# Patient Record
Sex: Female | Born: 1945 | Race: White | Hispanic: No | Smoking: Former smoker
Health system: Southern US, Community
[De-identification: ages and names within clinical notes are randomized; demographics above are authoritative.]

---

## 2004-05-16 ENCOUNTER — Ambulatory Visit (HOSPITAL_COMMUNITY): Admission: RE | Admit: 2004-05-16 | Discharge: 2004-05-16 | Payer: Self-pay | Admitting: Oncology

## 2004-10-25 ENCOUNTER — Other Ambulatory Visit: Admission: RE | Admit: 2004-10-25 | Discharge: 2004-10-25 | Payer: Self-pay | Admitting: Obstetrics and Gynecology

## 2004-11-08 ENCOUNTER — Ambulatory Visit: Payer: Self-pay | Admitting: Oncology

## 2004-11-21 ENCOUNTER — Encounter: Admission: RE | Admit: 2004-11-21 | Discharge: 2004-11-21 | Payer: Self-pay | Admitting: Obstetrics and Gynecology

## 2005-01-31 ENCOUNTER — Ambulatory Visit: Payer: Self-pay | Admitting: Oncology

## 2005-07-25 ENCOUNTER — Ambulatory Visit: Payer: Self-pay | Admitting: Oncology

## 2005-12-08 ENCOUNTER — Encounter: Admission: RE | Admit: 2005-12-08 | Discharge: 2005-12-08 | Payer: Self-pay | Admitting: Obstetrics and Gynecology

## 2006-01-09 ENCOUNTER — Ambulatory Visit: Payer: Self-pay | Admitting: Oncology

## 2006-06-26 ENCOUNTER — Ambulatory Visit: Payer: Self-pay | Admitting: Oncology

## 2006-12-04 ENCOUNTER — Ambulatory Visit: Payer: Self-pay | Admitting: Oncology

## 2006-12-05 ENCOUNTER — Ambulatory Visit: Payer: Self-pay | Admitting: Oncology

## 2006-12-13 ENCOUNTER — Encounter: Admission: RE | Admit: 2006-12-13 | Discharge: 2006-12-13 | Payer: Self-pay | Admitting: Obstetrics and Gynecology

## 2007-05-21 ENCOUNTER — Ambulatory Visit: Payer: Self-pay | Admitting: Oncology

## 2007-12-20 ENCOUNTER — Encounter: Admission: RE | Admit: 2007-12-20 | Discharge: 2007-12-20 | Payer: Self-pay | Admitting: Oncology

## 2008-12-21 ENCOUNTER — Encounter: Admission: RE | Admit: 2008-12-21 | Discharge: 2008-12-21 | Payer: Self-pay | Admitting: Oncology

## 2009-12-22 ENCOUNTER — Encounter: Admission: RE | Admit: 2009-12-22 | Discharge: 2009-12-22 | Payer: Self-pay | Admitting: Oncology

## 2011-01-10 ENCOUNTER — Other Ambulatory Visit: Payer: Self-pay | Admitting: Oncology

## 2011-01-10 DIAGNOSIS — Z9011 Acquired absence of right breast and nipple: Secondary | ICD-10-CM

## 2011-01-24 ENCOUNTER — Ambulatory Visit
Admission: RE | Admit: 2011-01-24 | Discharge: 2011-01-24 | Disposition: A | Discharge status: Home / Self Care | Payer: PRIVATE HEALTH INSURANCE | Source: Ambulatory Visit | Attending: Oncology | Admitting: Oncology

## 2011-01-24 DIAGNOSIS — Z9011 Acquired absence of right breast and nipple: Secondary | ICD-10-CM

## 2012-01-16 ENCOUNTER — Other Ambulatory Visit: Payer: Self-pay | Admitting: Oncology

## 2012-01-16 DIAGNOSIS — Z1231 Encounter for screening mammogram for malignant neoplasm of breast: Secondary | ICD-10-CM

## 2012-01-31 ENCOUNTER — Ambulatory Visit
Admission: RE | Admit: 2012-01-31 | Discharge: 2012-01-31 | Disposition: A | Discharge status: Home / Self Care | Payer: PRIVATE HEALTH INSURANCE | Source: Ambulatory Visit | Attending: Oncology | Admitting: Oncology

## 2012-01-31 DIAGNOSIS — Z1231 Encounter for screening mammogram for malignant neoplasm of breast: Secondary | ICD-10-CM

## 2012-02-06 ENCOUNTER — Other Ambulatory Visit: Payer: Self-pay | Admitting: Oncology

## 2012-02-06 DIAGNOSIS — R928 Other abnormal and inconclusive findings on diagnostic imaging of breast: Secondary | ICD-10-CM

## 2012-02-08 ENCOUNTER — Ambulatory Visit
Admission: RE | Admit: 2012-02-08 | Discharge: 2012-02-08 | Disposition: A | Discharge status: Home / Self Care | Payer: Medicare Other | Source: Ambulatory Visit | Attending: Oncology | Admitting: Oncology

## 2012-02-08 DIAGNOSIS — R928 Other abnormal and inconclusive findings on diagnostic imaging of breast: Secondary | ICD-10-CM

## 2013-02-12 ENCOUNTER — Other Ambulatory Visit: Payer: Self-pay | Admitting: Oncology

## 2013-02-12 DIAGNOSIS — Z1231 Encounter for screening mammogram for malignant neoplasm of breast: Secondary | ICD-10-CM

## 2013-03-13 ENCOUNTER — Ambulatory Visit: Payer: Medicare Other

## 2013-04-09 DIAGNOSIS — N289 Disorder of kidney and ureter, unspecified: Secondary | ICD-10-CM | POA: Insufficient documentation

## 2013-06-17 IMAGING — MG MM DIGITAL DIAGNOSTIC LIMITED*L*
2 series · 2 of 2 positions shown · non-contrast
Comparison: [DATE] [DATE], [DATE], [DATE] [DATE], [DATE]

CLINICAL DATA: Called back from screening mammogram for possible
mass left breast

DIGITAL DIAGNOSTIC LEFT MAMMOGRAM February 08, 2012

[L CC]
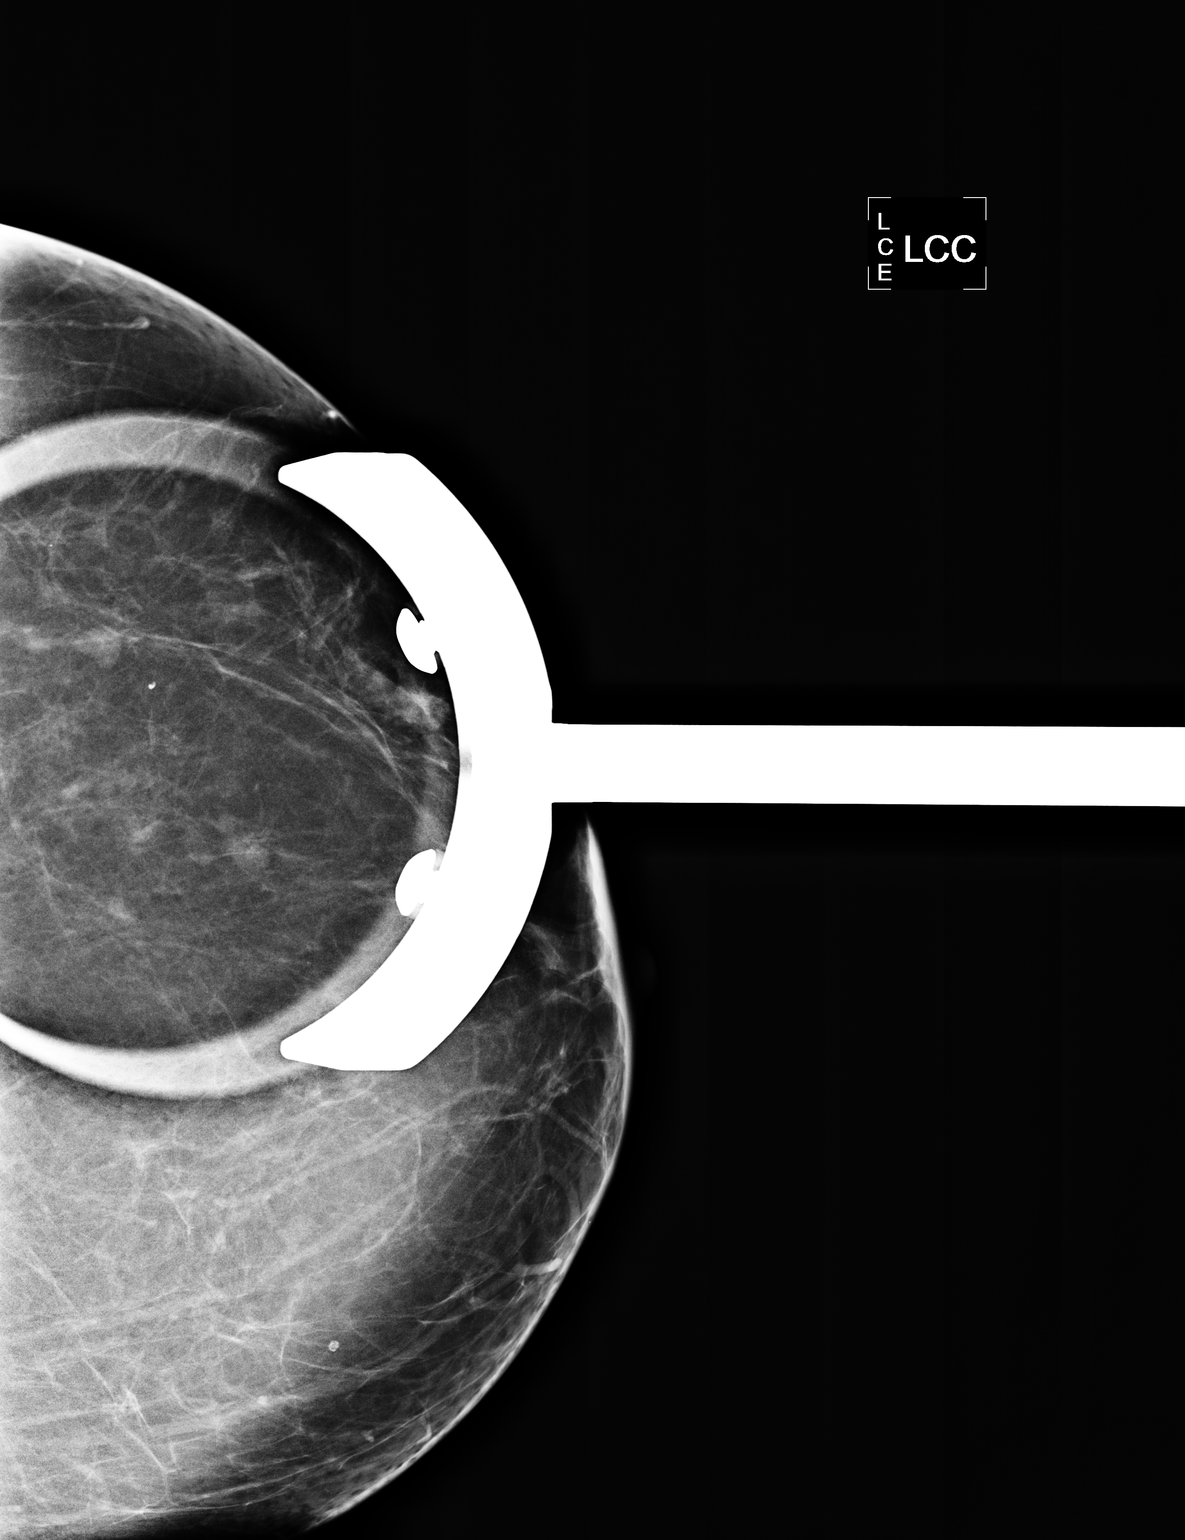

[L MLO]
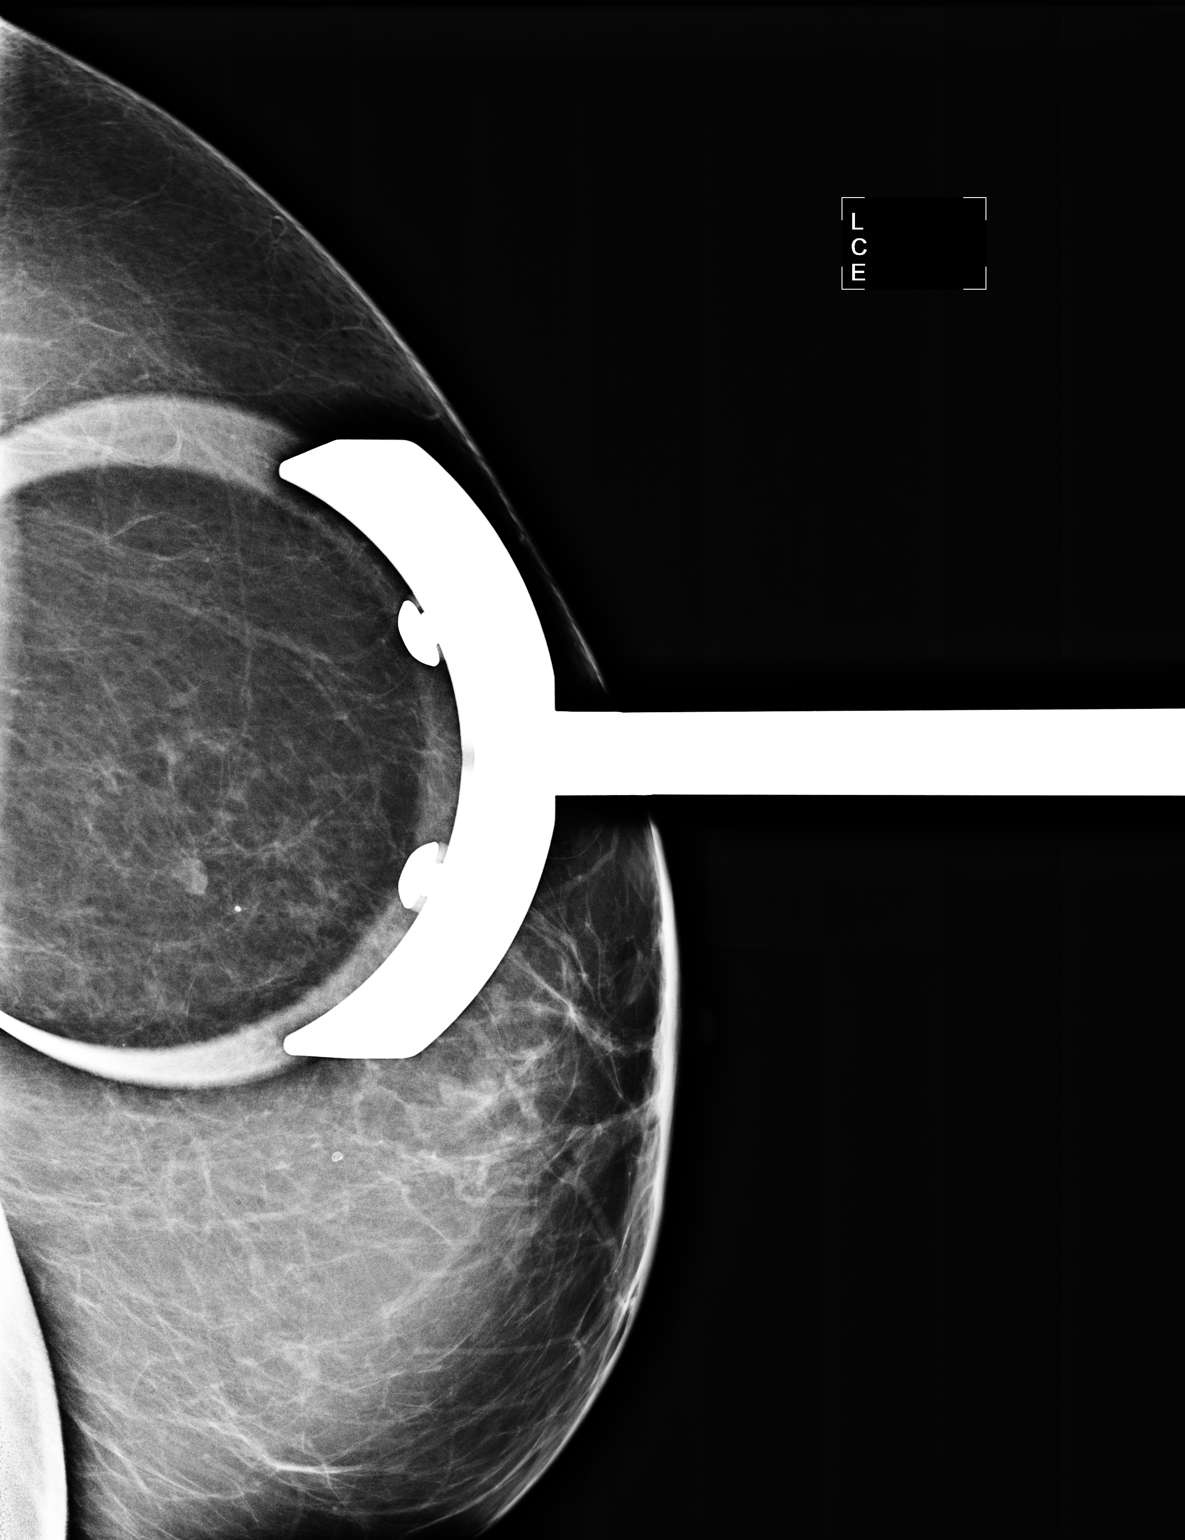

[2 of 2 positions shown; findings below may reference images not displayed]

FINDINGS: Spot compression CC and MLO views of the left breast are
submitted.  Previously questioned asymmetry does not persist and is
unchanged compared prior mammogram December 22, 2009.
IMPRESSION: Benign findings, recommend routine screening mammogram back on
schedule.

BI-RADS CATEGORY 2:  Benign finding(s).

## 2013-07-23 DIAGNOSIS — J45909 Unspecified asthma, uncomplicated: Secondary | ICD-10-CM | POA: Insufficient documentation

## 2014-03-16 ENCOUNTER — Ambulatory Visit: Payer: Medicare Other

## 2016-02-28 DIAGNOSIS — Z853 Personal history of malignant neoplasm of breast: Secondary | ICD-10-CM

## 2017-03-01 DIAGNOSIS — Z853 Personal history of malignant neoplasm of breast: Secondary | ICD-10-CM | POA: Diagnosis not present

## 2018-03-08 DIAGNOSIS — Z170 Estrogen receptor positive status [ER+]: Secondary | ICD-10-CM

## 2018-03-08 DIAGNOSIS — Z9221 Personal history of antineoplastic chemotherapy: Secondary | ICD-10-CM

## 2018-03-08 DIAGNOSIS — Z853 Personal history of malignant neoplasm of breast: Secondary | ICD-10-CM | POA: Diagnosis not present

## 2018-03-08 DIAGNOSIS — Z9011 Acquired absence of right breast and nipple: Secondary | ICD-10-CM | POA: Diagnosis not present

## 2019-03-10 DIAGNOSIS — Z853 Personal history of malignant neoplasm of breast: Secondary | ICD-10-CM

## 2019-08-13 DIAGNOSIS — I499 Cardiac arrhythmia, unspecified: Secondary | ICD-10-CM | POA: Insufficient documentation

## 2019-08-14 DIAGNOSIS — Z87891 Personal history of nicotine dependence: Secondary | ICD-10-CM | POA: Insufficient documentation

## 2019-08-14 DIAGNOSIS — I491 Atrial premature depolarization: Secondary | ICD-10-CM | POA: Insufficient documentation

## 2019-08-14 DIAGNOSIS — I493 Ventricular premature depolarization: Secondary | ICD-10-CM | POA: Insufficient documentation

## 2019-08-14 DIAGNOSIS — Z789 Other specified health status: Secondary | ICD-10-CM | POA: Insufficient documentation

## 2020-04-01 DIAGNOSIS — Z853 Personal history of malignant neoplasm of breast: Secondary | ICD-10-CM | POA: Diagnosis not present

## 2020-04-01 DIAGNOSIS — Z170 Estrogen receptor positive status [ER+]: Secondary | ICD-10-CM | POA: Diagnosis not present

## 2020-06-07 DIAGNOSIS — C50919 Malignant neoplasm of unspecified site of unspecified female breast: Secondary | ICD-10-CM

## 2020-11-01 NOTE — Progress Notes (Signed)
[facility]  [address] [place],  [place]  [address] [phone]  Clinic Day:  11/02/2020  Referring physician: No ref. provider found   HISTORY OF PRESENT ILLNESS:  The patient is a 74 y.o. 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  She is currently taking letrozole for her adjuvant endocrine therapy.  She comes in today for routine follow-up.  Since her last visit, the patient has been doing well.  She does have occasional musculoskeletal issues with her letrozole, but they are manageable.  She denies having any changes in her breast which concern her for early disease recurrence.  Of note, she also has a history of stage IIA hormone positive breast cancer, for which she underwent a right modified radical mastectomy in 2003, followed by 5 years of endocrine therapy.   PHYSICAL EXAM:  Blood pressure (!) 194/86, pulse 89, temperature 97.6 F (36.4 C), temperature source Oral, resp. rate 16, height 5\' 1"  (1.549 m), weight 156 lb 3.2 oz (70.9 kg), SpO2 90 %. Wt Readings from Last 3 Encounters:  11/02/20 156 lb 3.2 oz (70.9 kg)   Body mass index is 29.51 kg/m. Performance status (ECOG): 0 - Asymptomatic Physical Exam Constitutional:      Appearance: Normal appearance.  HENT:     Mouth/Throat:     Pharynx: Oropharynx is clear. No oropharyngeal exudate.  Cardiovascular:     Rate and Rhythm: Normal rate and regular rhythm.     Heart sounds: No murmur heard.  No friction rub. No gallop.   Pulmonary:     Breath sounds: Normal breath sounds.  Chest:     Breasts:        Right: No swelling, bleeding, inverted nipple, mass, nipple discharge or skin change.        Left: No swelling, bleeding, inverted nipple, mass, nipple discharge or skin change.  Abdominal:     General: Bowel sounds are normal. There is no distension.     Palpations: Abdomen is soft. There is no mass.      Tenderness: There is no abdominal tenderness.  Musculoskeletal:        General: No tenderness.     Cervical back: Normal range of motion and neck supple.     Right lower leg: No edema.     Left lower leg: No edema.  Lymphadenopathy:     Cervical: No cervical adenopathy.     Right cervical: No superficial, deep or posterior cervical adenopathy.    Left cervical: No superficial, deep or posterior cervical adenopathy.     Upper Body:     Right upper body: No supraclavicular or axillary adenopathy.     Left upper body: No supraclavicular or axillary adenopathy.     Lower Body: No right inguinal adenopathy. No left inguinal adenopathy.  Skin:    Coloration: Skin is not jaundiced.     Findings: No lesion or rash.  Neurological:     General: No focal deficit present.     Mental Status: She is alert and oriented to person, place, and time. Mental status is at baseline.  Psychiatric:        Mood and Affect: Mood normal.        Behavior: Behavior normal.        Thought Content: Thought content normal.        Judgment: Judgment normal.      ASSESSMENT & PLAN:  Assessment/Plan:  A 74 y.o. 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  Based upon her clinical breast exam today, the patient remains disease free.  She knows to continue taking letrozole on a daily basis for her adjuvant endocrine therapy.  I will see her back in 4 months for repeat clinical assessment.  The patient understands all the plans discussed today and is in agreement with them.      Maloni Musleh Macarthur Critchley, MD

## 2020-11-02 ENCOUNTER — Other Ambulatory Visit: Payer: Self-pay

## 2020-11-02 ENCOUNTER — Inpatient Hospital Stay: Payer: Medicare Other | Attending: Oncology | Admitting: Oncology

## 2020-11-02 VITALS — BP 194/86 | HR 89 | Temp 97.6°F | Resp 16 | Ht 61.0 in | Wt 156.2 lb

## 2020-11-02 DIAGNOSIS — C50411 Malignant neoplasm of upper-outer quadrant of right female breast: Secondary | ICD-10-CM | POA: Insufficient documentation

## 2020-11-02 DIAGNOSIS — C50212 Malignant neoplasm of upper-inner quadrant of left female breast: Secondary | ICD-10-CM | POA: Diagnosis not present

## 2020-11-02 DIAGNOSIS — C50412 Malignant neoplasm of upper-outer quadrant of left female breast: Secondary | ICD-10-CM | POA: Insufficient documentation

## 2020-11-02 DIAGNOSIS — Z170 Estrogen receptor positive status [ER+]: Secondary | ICD-10-CM

## 2021-03-03 ENCOUNTER — Inpatient Hospital Stay: Payer: Medicare Other | Admitting: Oncology

## 2021-03-03 NOTE — Progress Notes (Signed)
[facility]  [address] [place],  [place]  [address] [phone]  Clinic Day:  03/04/2021  Referring physician: No ref. provider found   HISTORY OF PRESENT ILLNESS:  The patient is a 75 y.o. 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  She is currently taking letrozole for her adjuvant endocrine therapy.  She comes in today for routine follow-up.  Since her last visit, the patient has been doing well.  She does have occasional musculoskeletal issues with her letrozole, but they are manageable.  She denies having any changes in her breast which concern her for early disease recurrence.  Of note, she also has a history of stage IIA hormone positive breast cancer, for which she underwent a right modified radical mastectomy in 2003, followed by 5 years of endocrine therapy.   PHYSICAL EXAM:  Blood pressure (!) 178/98, pulse 90, temperature 99 F (37.2 C), resp. rate 16, height 5\' 1"  (1.549 m), weight 152 lb (68.9 kg), SpO2 92 %. Wt Readings from Last 3 Encounters:  03/04/21 152 lb (68.9 kg)  11/02/20 156 lb 3.2 oz (70.9 kg)   Body mass index is 28.72 kg/m. Performance status (ECOG): 0 - Asymptomatic Physical Exam Constitutional:      Appearance: Normal appearance.  HENT:     Mouth/Throat:     Pharynx: Oropharynx is clear. No oropharyngeal exudate.  Cardiovascular:     Rate and Rhythm: Normal rate and regular rhythm.     Heart sounds: No murmur heard. No friction rub. No gallop.   Pulmonary:     Breath sounds: Normal breath sounds.  Chest:  Breasts:     Right: No swelling, bleeding, inverted nipple, mass, nipple discharge, skin change, axillary adenopathy or supraclavicular adenopathy.     Left: No swelling, bleeding, inverted nipple, mass, nipple discharge, skin change, axillary adenopathy or supraclavicular adenopathy.    Abdominal:     General: Bowel sounds are  normal. There is no distension.     Palpations: Abdomen is soft. There is no mass.     Tenderness: There is no abdominal tenderness.  Musculoskeletal:        General: No tenderness.     Cervical back: Normal range of motion and neck supple.     Right lower leg: No edema.     Left lower leg: No edema.  Lymphadenopathy:     Cervical: No cervical adenopathy.     Right cervical: No superficial, deep or posterior cervical adenopathy.    Left cervical: No superficial, deep or posterior cervical adenopathy.     Upper Body:     Right upper body: No supraclavicular or axillary adenopathy.     Left upper body: No supraclavicular or axillary adenopathy.     Lower Body: No right inguinal adenopathy. No left inguinal adenopathy.  Skin:    Coloration: Skin is not jaundiced.     Findings: No lesion or rash.  Neurological:     General: No focal deficit present.     Mental Status: She is alert and oriented to person, place, and time. Mental status is at baseline.  Psychiatric:        Mood and Affect: Mood normal.        Behavior: Behavior normal.        Thought Content: Thought content normal.        Judgment: Judgment normal.    ASSESSMENT & PLAN:  Assessment/Plan:  A 75 y.o. 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  Based upon her clinical breast exam today, the patient remains disease free.  She knows to continue taking letrozole on a daily basis for her adjuvant endocrine therapy.  I will see her back in 4 months for repeat clinical assessment.  Her mammogram has already been scheduled before her next visit for her continued radiographic breast cancer surveillance.  The patient understands all the plans discussed today and is in agreement with them.    Rajanee Schuelke Macarthur Critchley, MD

## 2021-03-04 ENCOUNTER — Encounter: Payer: Self-pay | Admitting: Oncology

## 2021-03-04 ENCOUNTER — Inpatient Hospital Stay: Payer: Medicare Other | Attending: Oncology | Admitting: Oncology

## 2021-03-04 ENCOUNTER — Other Ambulatory Visit: Payer: Self-pay

## 2021-03-04 ENCOUNTER — Telehealth: Payer: Self-pay | Admitting: Oncology

## 2021-03-04 VITALS — BP 178/98 | HR 90 | Temp 99.0°F | Resp 16 | Ht 61.0 in | Wt 152.0 lb

## 2021-03-04 DIAGNOSIS — Z170 Estrogen receptor positive status [ER+]: Secondary | ICD-10-CM

## 2021-03-04 DIAGNOSIS — C50412 Malignant neoplasm of upper-outer quadrant of left female breast: Secondary | ICD-10-CM

## 2021-03-04 NOTE — Telephone Encounter (Signed)
Per 3/18 los next appt schd and given to patient

## 2021-04-01 ENCOUNTER — Ambulatory Visit: Payer: Medicare Other | Admitting: Oncology

## 2021-06-30 NOTE — Progress Notes (Signed)
[facility]  [address] [place],  [place]  [address] [phone]  Clinic Day:  07/04/2021  Referring physician: [place]*  This document serves as a record of services personally performed by Marice Potter, MD. It was created on their behalf by Curry,Lauren E, a trained medical scribe. The creation of this record is based on the scribe's personal observations and the provider's statements to them.  HISTORY OF PRESENT ILLNESS:  The patient is a 75 y.o. 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  She comes in today for routine follow-up.  Since her last visit, the patient has been doing well.  She brings to my attention that she has stopped taking letrozole 1-2 months ago as she felt it was causing "heart issues."  When asking further about this, she was vague in the symptoms which caused her to stop her endocrine therapy.   She denies having any changes in her breasts which concern her for early disease recurrence.  Of note, her annual mammogram in April 2022 showed no evidence of disease recurrence. This patient also has a history of stage IIA hormone positive breast cancer, for which she underwent a right modified radical mastectomy in 2003, followed by 5 years of endocrine therapy.   PHYSICAL EXAM:  Blood pressure (!) 168/80, pulse 82, temperature 98.4 F (36.9 C), resp. rate 18, height 5\' 1"  (1.549 m), weight 150 lb 4.8 oz (68.2 kg), SpO2 92 %. Wt Readings from Last 3 Encounters:  07/04/21 150 lb 4.8 oz (68.2 kg)  03/04/21 152 lb (68.9 kg)  11/02/20 156 lb 3.2 oz (70.9 kg)   Body mass index is 28.4 kg/m. Performance status (ECOG): 0 - Asymptomatic Physical Exam Constitutional:      Appearance: Normal appearance.  HENT:     Mouth/Throat:     Pharynx: Oropharynx is clear. No oropharyngeal exudate.  Cardiovascular:     Rate and Rhythm: Normal rate and  regular rhythm.     Heart sounds: No murmur heard.   No friction rub. No gallop.  Pulmonary:     Breath sounds: Normal breath sounds.  Chest:  Breasts:    Right: No swelling, bleeding, inverted nipple, mass, nipple discharge, skin change, axillary adenopathy or supraclavicular adenopathy.     Left: No swelling, bleeding, inverted nipple, mass, nipple discharge, skin change, axillary adenopathy or supraclavicular adenopathy.  Abdominal:     General: Bowel sounds are normal. There is no distension.     Palpations: Abdomen is soft. There is no mass.     Tenderness: There is no abdominal tenderness.  Musculoskeletal:        General: No tenderness.     Cervical back: Normal range of motion and neck supple.     Right lower leg: No edema.     Left lower leg: No edema.  Lymphadenopathy:     Cervical: No cervical adenopathy.     Right cervical: No superficial, deep or posterior cervical adenopathy.    Left cervical: No superficial, deep or posterior cervical adenopathy.     Upper Body:     Right upper body: No supraclavicular or axillary adenopathy.     Left upper body: No supraclavicular or axillary adenopathy.     Lower Body: No right inguinal adenopathy. No left inguinal adenopathy.  Skin:    Coloration: Skin is not jaundiced.     Findings: No lesion or rash.  Neurological:     General: No focal deficit present.     Mental Status: She is alert and oriented to person, place, and time. Mental status is at baseline.  Psychiatric:        Mood and Affect: Mood normal.        Behavior: Behavior normal.        Thought Content: Thought content normal.        Judgment: Judgment normal.   ASSESSMENT & PLAN:  Assessment/Plan:  A 75 y.o. 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  Based upon her clinical breast exam today and her recent mammogram, the patient remains disease free.  The patient is not interested in restarting  any form of endocrine therapy for her disease.  She understands this is not what I recommend for her hormone positive disease, but she is willing to move forward with the side effects of adjuvant endocrine therapy not impacting her life.  I will see her back in 4 months for repeat clinical assessment.  The patient understands all the plans discussed today and is in agreement with them.     I, Rita Ohara, am acting as scribe for Marice Potter, MD    I have reviewed this report as typed by the medical scribe, and it is complete and accurate.  Tiger Spieker Macarthur Critchley, MD

## 2021-07-04 ENCOUNTER — Other Ambulatory Visit: Payer: Self-pay

## 2021-07-04 ENCOUNTER — Inpatient Hospital Stay: Payer: Medicare Other | Attending: Oncology | Admitting: Oncology

## 2021-07-04 ENCOUNTER — Telehealth: Payer: Self-pay | Admitting: Oncology

## 2021-07-04 VITALS — BP 168/80 | HR 82 | Temp 98.4°F | Resp 18 | Ht 61.0 in | Wt 150.3 lb

## 2021-07-04 DIAGNOSIS — Z170 Estrogen receptor positive status [ER+]: Secondary | ICD-10-CM | POA: Diagnosis not present

## 2021-07-04 DIAGNOSIS — C50412 Malignant neoplasm of upper-outer quadrant of left female breast: Secondary | ICD-10-CM

## 2021-07-04 NOTE — Telephone Encounter (Signed)
Per 7/18 LOS, patient scheduled for Nov Appt's.  Gave patient Appt [place]

## 2021-10-28 NOTE — Progress Notes (Incomplete)
[facility]  [address] [place],  [place]  [address] [phone]  Clinic Day:  11/04/2021  Referring physician: [place]*  This document serves as a record of services personally performed by Marice Potter, MD. It was created on their behalf by Curry,Lauren E, a trained medical scribe. The creation of this record is based on the scribe's personal observations and the provider's statements to them.  HISTORY OF PRESENT ILLNESS:  The patient is a 75 y.o. 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  She comes in today for routine follow-up.  Since her last visit, the patient has been doing well.  She brings to my attention that she has stopped taking letrozole 1-2 months ago as she felt it was causing "heart issues."  When asking further about this, she was vague in the symptoms which caused her to stop her endocrine therapy.   She denies having any changes in her breasts which concern her for early disease recurrence.  Of note, her annual mammogram in April 2022 showed no evidence of disease recurrence. This patient also has a history of stage IIA hormone positive breast cancer, for which she underwent a right modified radical mastectomy in 2003, followed by 5 years of endocrine therapy.   PHYSICAL EXAM:  There were no vitals taken for this visit. Wt Readings from Last 3 Encounters:  07/04/21 150 lb 4.8 oz (68.2 kg)  03/04/21 152 lb (68.9 kg)  11/02/20 156 lb 3.2 oz (70.9 kg)   There is no height or weight on file to calculate BMI. Performance status (ECOG): 0 - Asymptomatic Physical Exam Constitutional:      Appearance: Normal appearance.  HENT:     Mouth/Throat:     Pharynx: Oropharynx is clear. No oropharyngeal exudate.  Cardiovascular:     Rate and Rhythm: Normal rate and regular rhythm.     Heart sounds: No murmur heard.   No friction rub. No gallop.   Pulmonary:     Breath sounds: Normal breath sounds.  Chest:  Breasts:    Right: No swelling, bleeding, inverted nipple, mass, nipple discharge, skin change, axillary adenopathy or supraclavicular adenopathy.     Left: No swelling, bleeding, inverted nipple, mass, nipple discharge, skin change, axillary adenopathy or supraclavicular adenopathy.  Abdominal:     General: Bowel sounds are normal. There is no distension.     Palpations: Abdomen is soft. There is no mass.     Tenderness: There is no abdominal tenderness.  Musculoskeletal:        General: No tenderness.     Cervical back: Normal range of motion and neck supple.     Right lower leg: No edema.     Left lower leg: No edema.  Lymphadenopathy:     Cervical: No cervical adenopathy.     Right cervical: No superficial, deep or posterior cervical adenopathy.    Left cervical: No superficial, deep or posterior cervical adenopathy.     Upper Body:     Right upper body: No supraclavicular or axillary adenopathy.     Left upper body: No supraclavicular or axillary adenopathy.     Lower Body: No right inguinal adenopathy. No left inguinal adenopathy.  Skin:    Coloration: Skin is not jaundiced.     Findings: No lesion or rash.  Neurological:     General: No focal deficit present.     Mental  Status: She is alert and oriented to person, place, and time. Mental status is at baseline.  Psychiatric:        Mood and Affect: Mood normal.        Behavior: Behavior normal.        Thought Content: Thought content normal.        Judgment: Judgment normal.   ASSESSMENT & PLAN:  Assessment/Plan:  A 75 y.o. 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  Based upon her clinical breast exam today, the patient remains disease free.  The patient is not interested in restarting any form of endocrine therapy for her disease.  She understands this is not what I recommend for her  hormone positive disease, but she is willing to move forward with the side effects of adjuvant endocrine therapy not impacting her life.  I will see her back in 4 months for repeat clinical assessment.  The patient understands all the plans discussed today and is in agreement with them.     I, Rita Ohara, am acting as scribe for Marice Potter, MD    I have reviewed this report as typed by the medical scribe, and it is complete and accurate.  Dequincy Macarthur Critchley, MD

## 2021-11-04 ENCOUNTER — Ambulatory Visit: Payer: Medicare Other | Admitting: Oncology

## 2021-11-09 NOTE — Progress Notes (Signed)
[facility]  [address] [place],  [place]  [address] [phone]  Clinic Day:  11/14/2021  Referring physician: [place]*  This document serves as a record of services personally performed by Marice Potter, MD. It was created on their behalf by Curry,Lauren E, a trained medical scribe. The creation of this record is based on the scribe's personal observations and the provider's statements to them.  HISTORY OF PRESENT ILLNESS:  The patient is a 75 y.o. 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  She comes in today for routine follow-up.  Since her last visit, the patient has been doing well.  She remains off endocrine therapy due to it causing nondescript side effects.   She denies having any changes in her breast/chest wall region which concern her for early disease recurrence.    This patient also has a history of stage IIA hormone positive breast cancer, for which she underwent a right modified radical mastectomy in 2003, followed by 5 years of endocrine therapy.   PHYSICAL EXAM:  Blood pressure 136/79, pulse 100, temperature 97.9 F (36.6 C), temperature source Oral, resp. rate 18, height 5\' 1"  (1.549 m), weight 141 lb 6.4 oz (64.1 kg), SpO2 92 %. Wt Readings from Last 3 Encounters:  11/14/21 141 lb 6.4 oz (64.1 kg)  07/04/21 150 lb 4.8 oz (68.2 kg)  03/04/21 152 lb (68.9 kg)   Body mass index is 26.72 kg/m. Performance status (ECOG): 0 - Asymptomatic Physical Exam Constitutional:      Appearance: Normal appearance.  HENT:     Mouth/Throat:     Pharynx: Oropharynx is clear. No oropharyngeal exudate.  Cardiovascular:     Rate and Rhythm: Normal rate and regular rhythm.     Heart sounds: No murmur heard.   No friction rub. No gallop.  Pulmonary:     Breath sounds: Normal breath sounds.  Chest:  Breasts:    Right: No swelling, bleeding, inverted  nipple, mass, nipple discharge or skin change.     Left: No swelling, bleeding, inverted nipple, mass, nipple discharge or skin change.     Comments: Reconstructive right breast Abdominal:     General: Bowel sounds are normal. There is no distension.     Palpations: Abdomen is soft. There is no mass.     Tenderness: There is no abdominal tenderness.  Musculoskeletal:        General: No tenderness.     Cervical back: Normal range of motion and neck supple.     Right lower leg: No edema.     Left lower leg: No edema.  Lymphadenopathy:     Cervical: No cervical adenopathy.     Right cervical: No superficial, deep or posterior cervical adenopathy.    Left cervical: No superficial, deep or posterior cervical adenopathy.     Upper Body:     Right upper body: No supraclavicular or axillary adenopathy.     Left upper body: No supraclavicular or axillary adenopathy.     Lower Body: No right inguinal adenopathy. No left inguinal adenopathy.  Skin:    Coloration: Skin is not jaundiced.     Findings: No lesion or rash.  Neurological:     General: No focal deficit present.     Mental Status: She is alert and oriented to person, place, and time. Mental status is at baseline.  Psychiatric:  Mood and Affect: Mood normal.        Behavior: Behavior normal.        Thought Content: Thought content normal.        Judgment: Judgment normal.   ASSESSMENT & PLAN:  Assessment/Plan:  A 75 y.o. 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  Based upon her clinical breast exam today, the patient remains disease free.  The patient remains uninterested in restarting endocrine therapy for her disease despite knowing it is standard of care to use it for her breast cancer.  Clinically she is doing well.  I will see her back in 4 months for repeat clinical assessment.  The patient understands all the plans discussed today and is in agreement  with them.     I, Rita Ohara, am acting as scribe for Marice Potter, MD    I have reviewed this report as typed by the medical scribe, and it is complete and accurate.  Axton Cihlar Macarthur Critchley, MD

## 2021-11-14 ENCOUNTER — Encounter: Payer: Self-pay | Admitting: Oncology

## 2021-11-14 ENCOUNTER — Telehealth: Payer: Self-pay | Admitting: Oncology

## 2021-11-14 ENCOUNTER — Other Ambulatory Visit: Payer: Self-pay

## 2021-11-14 ENCOUNTER — Inpatient Hospital Stay: Payer: Medicare Other | Attending: Oncology | Admitting: Oncology

## 2021-11-14 VITALS — BP 136/79 | HR 100 | Temp 97.9°F | Resp 18 | Ht 61.0 in | Wt 141.4 lb

## 2021-11-14 DIAGNOSIS — C50412 Malignant neoplasm of upper-outer quadrant of left female breast: Secondary | ICD-10-CM | POA: Diagnosis not present

## 2021-11-14 DIAGNOSIS — Z170 Estrogen receptor positive status [ER+]: Secondary | ICD-10-CM

## 2021-11-14 NOTE — Telephone Encounter (Signed)
Per 11/28 los next appt scheduled and given to patient-4 mth f/u

## 2022-03-13 NOTE — Progress Notes (Signed)
?[facility]  ?[address] ?[place],  [place]  [address] ?[phone] ? ?Clinic Day:  11/14/2021 ? ?Referring physician: [facility]* ? ? ?HISTORY OF PRESENT ILLNESS:  ?The patient is a 76 y.o. 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  She comes in today for routine follow-up.  Since her last visit, the patient has been doing well.  She remains off endocrine therapy due to it causing nondescript side effects and has consistently stated her disinterest in restarting such treatment.   She denies having any changes in her breast/chest wall region which concern her for early disease recurrence.  This patient also has a history of stage IIA hormone positive breast cancer, for which she underwent a right modified radical mastectomy in 2003, followed by 5 years of endocrine therapy.  ? ?PHYSICAL EXAM:  ?Blood pressure (!) 211/85, pulse 80, temperature 97.9 ?F (36.6 ?C), resp. rate 16, height '5\' 1"'$  (1.549 m), weight 144 lb 14.4 oz (65.7 kg), SpO2 90 %. ?Wt Readings from Last 3 Encounters:  ?03/14/22 144 lb 14.4 oz (65.7 kg)  ?11/14/21 141 lb 6.4 oz (64.1 kg)  ?07/04/21 150 lb 4.8 oz (68.2 kg)  ? ?Body mass index is 27.38 kg/m?Marland Kitchen ?Performance status (ECOG): 0 - Asymptomatic ?Physical Exam ?Constitutional:   ?   Appearance: Normal appearance.  ?HENT:  ?   Mouth/Throat:  ?   Pharynx: Oropharynx is clear. No oropharyngeal exudate.  ?Cardiovascular:  ?   Rate and Rhythm: Normal rate and regular rhythm.  ?   Heart sounds: No murmur heard. ?  No friction rub. No gallop.  ?Pulmonary:  ?   Breath sounds: Normal breath sounds.  ?Chest:  ?Breasts: ?   Right: No swelling, bleeding, inverted nipple, mass, nipple discharge or skin change.  ?   Left: No swelling, bleeding, inverted nipple, mass, nipple discharge or skin change.  ?   Comments: Reconstructed right breast ?Abdominal:  ?   General: Bowel sounds are normal.  There is no distension.  ?   Palpations: Abdomen is soft. There is no mass.  ?   Tenderness: There is no abdominal tenderness.  ?Musculoskeletal:     ?   General: No tenderness.  ?   Cervical back: Normal range of motion and neck supple.  ?   Right lower leg: No edema.  ?   Left lower leg: No edema.  ?Lymphadenopathy:  ?   Cervical: No cervical adenopathy.  ?   Right cervical: No superficial, deep or posterior cervical adenopathy. ?   Left cervical: No superficial, deep or posterior cervical adenopathy.  ?   Upper Body:  ?   Right upper body: No supraclavicular or axillary adenopathy.  ?   Left upper body: No supraclavicular or axillary adenopathy.  ?   Lower Body: No right inguinal adenopathy. No left inguinal adenopathy.  ?Skin: ?   Coloration: Skin is not jaundiced.  ?   Findings: No lesion or rash.  ?Neurological:  ?   General: No focal deficit present.  ?   Mental Status: She is alert and oriented to person, place, and time. Mental status is at baseline.  ?Psychiatric:     ?   Mood and Affect: Mood normal.     ?   Behavior: Behavior normal.     ?   Thought Content: Thought content normal.     ?   Judgment: Judgment  normal.  ? ?ASSESSMENT & PLAN:  ?Assessment/Plan:  A 76 y.o. 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  Based upon her clinical breast exam today, the patient remains disease free.  The patient remains uninterested in restarting endocrine therapy for her disease despite knowing it is standard of care to use it for her breast cancer.  Of note, she is already scheduled to undergo her annual mammogram in April 2023.  Clinically she is doing well.  As that is the case, I do feel comfortable spacing all future clinical breast exams out to every 6 months.  The patient understands all the plans discussed today and is in agreement with them.   ? ?Sehaj Kolden Macarthur Critchley, MD ? ? ? ?  ?

## 2022-03-14 ENCOUNTER — Other Ambulatory Visit: Payer: Self-pay

## 2022-03-14 ENCOUNTER — Telehealth: Payer: Self-pay | Admitting: Oncology

## 2022-03-14 ENCOUNTER — Inpatient Hospital Stay: Payer: Medicare Other | Attending: Oncology | Admitting: Oncology

## 2022-03-14 VITALS — BP 211/85 | HR 80 | Temp 97.9°F | Resp 16 | Ht 61.0 in | Wt 144.9 lb

## 2022-03-14 DIAGNOSIS — Z170 Estrogen receptor positive status [ER+]: Secondary | ICD-10-CM | POA: Diagnosis not present

## 2022-03-14 DIAGNOSIS — C50412 Malignant neoplasm of upper-outer quadrant of left female breast: Secondary | ICD-10-CM | POA: Diagnosis not present

## 2022-03-14 NOTE — Telephone Encounter (Signed)
Per 03/14/22 los next appt scheduled and confirmed with patient ?

## 2022-09-13 NOTE — Progress Notes (Signed)
[facility]  [address] [place],  [place]  [address] [phone]  Clinic Day:  09/14/2022  Referring physician: [facility]  HISTORY OF PRESENT ILLNESS:  The patient is a 76 y.o. 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  She comes in today for routine follow-up.  Since her last visit, the patient has been doing well.  She remains off endocrine therapy due to it causing nondescript side effects; she has consistently stated her disinterest in restarting endocrine therapy.  She denies having any changes in her breast/chest wall region which concern her for early disease recurrence.  Of note, her annual mammogram in April 2023 showed no evidence of disease recurrence.    This patient also has a history of stage IIA hormone positive breast cancer, for which she underwent a right modified radical mastectomy in 2003, followed by 5 years of endocrine therapy.   PHYSICAL EXAM:  Blood pressure (!) 146/81, pulse 81, temperature 98.4 F (36.9 C), resp. rate 16, height '5\' 1"'$  (1.549 m), weight 150 lb 4.8 oz (68.2 kg), SpO2 90 %. Wt Readings from Last 3 Encounters:  09/14/22 150 lb 4.8 oz (68.2 kg)  03/14/22 144 lb 14.4 oz (65.7 kg)  11/14/21 141 lb 6.4 oz (64.1 kg)   Body mass index is 28.4 kg/m. Performance status (ECOG): 0 - Asymptomatic Physical Exam Constitutional:      Appearance: Normal appearance.  HENT:     Mouth/Throat:     Pharynx: Oropharynx is clear. No oropharyngeal exudate.  Cardiovascular:     Rate and Rhythm: Normal rate and regular rhythm.     Heart sounds: No murmur heard.    No friction rub. No gallop.  Pulmonary:     Breath sounds: Normal breath sounds.  Chest:  Breasts:    Right: No swelling, bleeding, inverted nipple, mass, nipple discharge or skin change.     Left: No swelling, bleeding, inverted nipple, mass, nipple discharge or skin change.      Comments: Reconstructed right breast Abdominal:     General: Bowel sounds are normal. There is no distension.     Palpations: Abdomen is soft. There is no mass.     Tenderness: There is no abdominal tenderness.  Musculoskeletal:        General: No tenderness.     Cervical back: Normal range of motion and neck supple.     Right lower leg: No edema.     Left lower leg: No edema.  Lymphadenopathy:     Cervical: No cervical adenopathy.     Right cervical: No superficial, deep or posterior cervical adenopathy.    Left cervical: No superficial, deep or posterior cervical adenopathy.     Upper Body:     Right upper body: No supraclavicular or axillary adenopathy.     Left upper body: No supraclavicular or axillary adenopathy.     Lower Body: No right inguinal adenopathy. No left inguinal adenopathy.  Skin:    Coloration: Skin is not jaundiced.     Findings: No lesion or rash.  Neurological:     General: No focal deficit present.     Mental Status: She is alert and oriented to person, place, and time. Mental status is at baseline.  Psychiatric:        Mood and Affect: Mood normal.        Behavior: Behavior normal.  Thought Content: Thought content normal.        Judgment: Judgment normal.    ASSESSMENT & PLAN:  Assessment/Plan:  A 76 y.o. 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  Based upon her clinical breast exam today and recent mammogram, the patient remains disease free.  The patient remains uninterested in restarting endocrine therapy for her disease despite knowing it is standard of care for her breast cancer.  Clinically she is doing well.  As that is the case, I will see her back in 6 months for her next clinical breast exam.  The patient understands all the plans discussed today and is in agreement with them.    Kristie Bracewell Macarthur Critchley, MD

## 2022-09-14 ENCOUNTER — Inpatient Hospital Stay: Payer: Medicare Other | Attending: Oncology | Admitting: Oncology

## 2022-09-14 VITALS — BP 146/81 | HR 81 | Temp 98.4°F | Resp 16 | Ht 61.0 in | Wt 150.3 lb

## 2022-09-14 DIAGNOSIS — C50412 Malignant neoplasm of upper-outer quadrant of left female breast: Secondary | ICD-10-CM

## 2022-09-14 DIAGNOSIS — Z170 Estrogen receptor positive status [ER+]: Secondary | ICD-10-CM | POA: Diagnosis not present

## 2023-03-16 ENCOUNTER — Inpatient Hospital Stay: Payer: Medicare Other | Admitting: Oncology

## 2023-03-19 NOTE — Progress Notes (Unsigned)
[facility]  [address] [place],  [place]  [address] [phone]  Clinic Day:  09/14/2022  Referring physician: [facility]  HISTORY OF PRESENT ILLNESS:  The patient is a 77 y.o. 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  She comes in today for routine follow-up.  Since her last visit, the patient has been doing well.  She remains off endocrine therapy due to it causing nondescript side effects; she has consistently stated her disinterest in restarting endocrine therapy.  She denies having any changes in her breast/chest wall region which concern her for early disease recurrence.  Of note, her annual mammogram in April 2023 showed no evidence of disease recurrence.    This patient also has a history of stage IIA hormone positive breast cancer, for which she underwent a right modified radical mastectomy in 2003, followed by 5 years of endocrine therapy.   PHYSICAL EXAM:  There were no vitals taken for this visit. Wt Readings from Last 3 Encounters:  09/14/22 150 lb 4.8 oz (68.2 kg)  03/14/22 144 lb 14.4 oz (65.7 kg)  11/14/21 141 lb 6.4 oz (64.1 kg)   There is no height or weight on file to calculate BMI. Performance status (ECOG): 0 - Asymptomatic Physical Exam Constitutional:      Appearance: Normal appearance.  HENT:     Mouth/Throat:     Pharynx: Oropharynx is clear. No oropharyngeal exudate.  Cardiovascular:     Rate and Rhythm: Normal rate and regular rhythm.     Heart sounds: No murmur heard.    No friction rub. No gallop.  Pulmonary:     Breath sounds: Normal breath sounds.  Chest:  Breasts:    Right: No swelling, bleeding, inverted nipple, mass, nipple discharge or skin change.     Left: No swelling, bleeding, inverted nipple, mass, nipple discharge or skin change.     Comments: Reconstructed right breast Abdominal:     General: Bowel sounds are  normal. There is no distension.     Palpations: Abdomen is soft. There is no mass.     Tenderness: There is no abdominal tenderness.  Musculoskeletal:        General: No tenderness.     Cervical back: Normal range of motion and neck supple.     Right lower leg: No edema.     Left lower leg: No edema.  Lymphadenopathy:     Cervical: No cervical adenopathy.     Right cervical: No superficial, deep or posterior cervical adenopathy.    Left cervical: No superficial, deep or posterior cervical adenopathy.     Upper Body:     Right upper body: No supraclavicular or axillary adenopathy.     Left upper body: No supraclavicular or axillary adenopathy.     Lower Body: No right inguinal adenopathy. No left inguinal adenopathy.  Skin:    Coloration: Skin is not jaundiced.     Findings: No lesion or rash.  Neurological:     General: No focal deficit present.     Mental Status: She is alert and oriented to person, place, and time. Mental status is at baseline.  Psychiatric:        Mood and Affect: Mood normal.        Behavior: Behavior normal.        Thought Content: Thought content normal.        Judgment: Judgment  normal.    ASSESSMENT & PLAN:  Assessment/Plan:  A 77 y.o. 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  Based upon her clinical breast exam today and recent mammogram, the patient remains disease free.  The patient remains uninterested in restarting endocrine therapy for her disease despite knowing it is standard of care for her breast cancer.  Clinically she is doing well.  As that is the case, I will see her back in 6 months for her next clinical breast exam.  The patient understands all the plans discussed today and is in agreement with them.    Kenneth Cuaresma Macarthur Critchley, MD

## 2023-03-20 ENCOUNTER — Telehealth: Payer: Self-pay | Admitting: Oncology

## 2023-03-20 ENCOUNTER — Inpatient Hospital Stay: Payer: Medicare Other | Attending: Oncology | Admitting: Oncology

## 2023-03-20 VITALS — BP 175/79 | HR 88 | Temp 99.2°F | Resp 16 | Ht 61.0 in | Wt 148.3 lb

## 2023-03-20 DIAGNOSIS — C50412 Malignant neoplasm of upper-outer quadrant of left female breast: Secondary | ICD-10-CM

## 2023-03-20 DIAGNOSIS — Z170 Estrogen receptor positive status [ER+]: Secondary | ICD-10-CM | POA: Diagnosis not present

## 2023-03-20 NOTE — Telephone Encounter (Signed)
03/20/23 Next appt scheduled and confirmed with patient

## 2023-09-20 ENCOUNTER — Ambulatory Visit: Payer: Medicare Other | Admitting: Oncology

## 2023-10-09 ENCOUNTER — Inpatient Hospital Stay: Payer: Medicare Other | Admitting: Oncology

## 2023-10-09 NOTE — Progress Notes (Deleted)
[facility]  [address] [place],  [place]  [address] [phone]  Clinic Day:  03/20/2023  Referring physician: Retia Passe, NP  HISTORY OF PRESENT ILLNESS:  The patient is a 77 y.o. 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  She comes in today for routine follow-up.  Since her last visit, the patient has been doing well.  She remains off endocrine therapy due to it causing nondescript side effects; she continues to state her disinterest in restarting endocrine therapy.  She denies having any changes in her breast/chest wall region which concern her for early disease recurrence.  Of note, her annual mammogram in April 2024 showed no evidence of disease recurrence.    This patient also has a history of stage IIA hormone positive breast cancer, for which she underwent a right modified radical mastectomy in 2003, followed by 5 years of endocrine therapy.   PHYSICAL EXAM:  There were no vitals taken for this visit. Wt Readings from Last 3 Encounters:  03/20/23 148 lb 4.8 oz (67.3 kg)  09/14/22 150 lb 4.8 oz (68.2 kg)  03/14/22 144 lb 14.4 oz (65.7 kg)   There is no height or weight on file to calculate BMI. Performance status (ECOG): 0 - Asymptomatic Physical Exam Constitutional:      Appearance: Normal appearance.  HENT:     Mouth/Throat:     Pharynx: Oropharynx is clear. No oropharyngeal exudate.  Cardiovascular:     Rate and Rhythm: Normal rate and regular rhythm.     Heart sounds: No murmur heard.    No friction rub. No gallop.  Pulmonary:     Breath sounds: Normal breath sounds.  Chest:  Breasts:    Right: No swelling, bleeding, inverted nipple, mass, nipple discharge or skin change.     Left: No swelling, bleeding, inverted nipple, mass, nipple discharge or skin change.     Comments: Reconstructed right breast Abdominal:     General: Bowel sounds are normal.  There is no distension.     Palpations: Abdomen is soft. There is no mass.     Tenderness: There is no abdominal tenderness.  Musculoskeletal:        General: No tenderness.     Cervical back: Normal range of motion and neck supple.     Right lower leg: No edema.     Left lower leg: No edema.  Lymphadenopathy:     Cervical: No cervical adenopathy.     Right cervical: No superficial, deep or posterior cervical adenopathy.    Left cervical: No superficial, deep or posterior cervical adenopathy.     Upper Body:     Right upper body: No supraclavicular or axillary adenopathy.     Left upper body: No supraclavicular or axillary adenopathy.     Lower Body: No right inguinal adenopathy. No left inguinal adenopathy.  Skin:    Coloration: Skin is not jaundiced.     Findings: No lesion or rash.  Neurological:     General: No focal deficit present.     Mental Status: She is alert and oriented to person, place, and time. Mental status is at baseline.  Psychiatric:        Mood and Affect: Mood normal.        Behavior: Behavior normal.        Thought Content: Thought content normal.        Judgment:  Judgment normal.    ASSESSMENT & PLAN:  Assessment/Plan:  A 77 y.o. 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  Based upon her clinical breast exam today, the patient remains disease free.  As mentioned previously, the patient remains uninterested in restarting endocrine therapy for her disease despite knowing it is standard of care for her hormone positive breast cancer.  Clinically she is doing well.  As that is the case, I will see her back in 6 months for her next clinical breast exam.  The patient understands all the plans discussed today and is in agreement with them.    Jaishawn Witzke Kirby Funk, MD

## 2023-10-17 NOTE — Progress Notes (Unsigned)
[facility]  [address] [place],  [place]  [address] [phone]  Clinic Day:  03/20/2023  Referring physician: Retia Passe, NP  HISTORY OF PRESENT ILLNESS:  The patient is a 77 y.o. 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  She comes in today for routine follow-up.  Since her last visit, the patient has been doing well.  She remains off endocrine therapy due to it causing nondescript side effects; she continues to state her disinterest in restarting endocrine therapy.  She denies having any changes in her breast/chest wall region which concern her for early disease recurrence.  Of note, her annual mammogram in April 2024 showed no evidence of disease recurrence.  This patient also has a history of stage IIA hormone positive breast cancer, for which she underwent a right modified radical mastectomy in 2003, followed by 5 years of endocrine therapy.   PHYSICAL EXAM:  There were no vitals taken for this visit. Wt Readings from Last 3 Encounters:  03/20/23 148 lb 4.8 oz (67.3 kg)  09/14/22 150 lb 4.8 oz (68.2 kg)  03/14/22 144 lb 14.4 oz (65.7 kg)   There is no height or weight on file to calculate BMI. Performance status (ECOG): 0 - Asymptomatic Physical Exam Constitutional:      Appearance: Normal appearance.  HENT:     Mouth/Throat:     Pharynx: Oropharynx is clear. No oropharyngeal exudate.  Cardiovascular:     Rate and Rhythm: Normal rate and regular rhythm.     Heart sounds: No murmur heard.    No friction rub. No gallop.  Pulmonary:     Breath sounds: Normal breath sounds.  Chest:  Breasts:    Right: No swelling, bleeding, inverted nipple, mass, nipple discharge or skin change.     Left: No swelling, bleeding, inverted nipple, mass, nipple discharge or skin change.     Comments: Reconstructed right breast Abdominal:     General: Bowel sounds are normal. There  is no distension.     Palpations: Abdomen is soft. There is no mass.     Tenderness: There is no abdominal tenderness.  Musculoskeletal:        General: No tenderness.     Cervical back: Normal range of motion and neck supple.     Right lower leg: No edema.     Left lower leg: No edema.  Lymphadenopathy:     Cervical: No cervical adenopathy.     Right cervical: No superficial, deep or posterior cervical adenopathy.    Left cervical: No superficial, deep or posterior cervical adenopathy.     Upper Body:     Right upper body: No supraclavicular or axillary adenopathy.     Left upper body: No supraclavicular or axillary adenopathy.     Lower Body: No right inguinal adenopathy. No left inguinal adenopathy.  Skin:    Coloration: Skin is not jaundiced.     Findings: No lesion or rash.  Neurological:     General: No focal deficit present.     Mental Status: She is alert and oriented to person, place, and time. Mental status is at baseline.  Psychiatric:        Mood and Affect: Mood normal.        Behavior: Behavior normal.        Thought Content: Thought content normal.        Judgment: Judgment normal.  ASSESSMENT & PLAN:  Assessment/Plan:  A 77 y.o. 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  Based upon her clinical breast exam today, the patient remains disease free.  As mentioned previously, the patient remains uninterested in restarting endocrine therapy for her disease despite knowing it is standard of care for her hormone positive breast cancer.  Clinically she is doing well.  As that is the case, I will see her back in 6 months for her next clinical breast exam.  The patient understands all the plans discussed today and is in agreement with them.    Eshaan Titzer Kirby Funk, MD

## 2023-10-18 ENCOUNTER — Other Ambulatory Visit: Payer: Self-pay | Admitting: Oncology

## 2023-10-18 ENCOUNTER — Inpatient Hospital Stay: Payer: Medicare Other | Attending: Oncology | Admitting: Oncology

## 2023-10-18 VITALS — BP 175/79 | HR 89 | Temp 98.3°F | Resp 14 | Ht 61.0 in | Wt 128.8 lb

## 2023-10-18 DIAGNOSIS — C50411 Malignant neoplasm of upper-outer quadrant of right female breast: Secondary | ICD-10-CM | POA: Diagnosis not present

## 2023-10-18 DIAGNOSIS — Z1231 Encounter for screening mammogram for malignant neoplasm of breast: Secondary | ICD-10-CM

## 2023-10-18 DIAGNOSIS — Z170 Estrogen receptor positive status [ER+]: Secondary | ICD-10-CM

## 2024-04-16 NOTE — Progress Notes (Deleted)
 [facility]  [address] [place],  [place]  [address] [phone]  Clinic Day:  10/18/2023  Referring physician: Holly Lush, NP  HISTORY OF PRESENT ILLNESS:  The patient is a 78 y.o. 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  She comes in today for routine follow-up.  Since her last visit, the patient has been doing well.  She remains off endocrine therapy due to it causing nondescript side effects; she continues to state her disinterest in restarting endocrine therapy.  She denies having any changes in her breast/chest wall region which concern her for early disease recurrence.  Of note, her annual mammogram done in late April 2025 continued to show no evidence of disease recurrence.    This patient also has a history of stage IIA hormone positive breast cancer, for which she underwent a right modified radical mastectomy in 2003, followed by 5 years of endocrine therapy.   PHYSICAL EXAM:  There were no vitals taken for this visit. Wt Readings from Last 3 Encounters:  10/18/23 128 lb 12.8 oz (58.4 kg)  03/20/23 148 lb 4.8 oz (67.3 kg)  09/14/22 150 lb 4.8 oz (68.2 kg)   There is no height or weight on file to calculate BMI. Performance status (ECOG): 0 - Asymptomatic Physical Exam Constitutional:      Appearance: Normal appearance.  HENT:     Mouth/Throat:     Pharynx: Oropharynx is clear. No oropharyngeal exudate.  Cardiovascular:     Rate and Rhythm: Normal rate and regular rhythm.     Heart sounds: No murmur heard.    No friction rub. No gallop.  Pulmonary:     Breath sounds: Normal breath sounds.  Chest:  Breasts:    Right: No swelling, bleeding, inverted nipple, mass, nipple discharge or skin change.     Left: No swelling, bleeding, inverted nipple, mass, nipple discharge or skin change.     Comments: Reconstructed right breast Abdominal:     General: Bowel  sounds are normal. There is no distension.     Palpations: Abdomen is soft. There is no mass.     Tenderness: There is no abdominal tenderness.  Musculoskeletal:        General: No tenderness.     Cervical back: Normal range of motion and neck supple.     Right lower leg: No edema.     Left lower leg: No edema.  Lymphadenopathy:     Cervical: No cervical adenopathy.     Right cervical: No superficial, deep or posterior cervical adenopathy.    Left cervical: No superficial, deep or posterior cervical adenopathy.     Upper Body:     Right upper body: No supraclavicular or axillary adenopathy.     Left upper body: No supraclavicular or axillary adenopathy.     Lower Body: No right inguinal adenopathy. No left inguinal adenopathy.  Skin:    Coloration: Skin is not jaundiced.     Findings: No lesion or rash.  Neurological:     General: No focal deficit present.     Mental Status: She is alert and oriented to person, place, and time. Mental status is at baseline.  Psychiatric:        Mood and Affect: Mood normal.        Behavior: Behavior normal.        Thought Content: Thought content normal.  Judgment: Judgment normal.    ASSESSMENT & PLAN:  Assessment/Plan:  A 78 y.o. 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  Based upon her clinical breast exam today, the patient remains disease free.  As mentioned previously, the patient remains uninterested in restarting endocrine therapy for her disease despite knowing it is standard of care for her hormone positive breast cancer.  Clinically she is doing well.  As that is the case, I will see her back in 6 months for her next clinical breast exam.  Her annual mammogram will be scheduled before her next visit for her continued breast cancer surveillance.  The patient understands all the plans discussed today and is in agreement with them.    Adelaido Nicklaus Felicia Horde, MD

## 2024-04-17 ENCOUNTER — Inpatient Hospital Stay: Payer: Medicare Other | Admitting: Oncology

## 2024-04-24 ENCOUNTER — Inpatient Hospital Stay: Admitting: Oncology

## 2024-04-29 ENCOUNTER — Other Ambulatory Visit: Payer: Self-pay

## 2024-04-29 ENCOUNTER — Encounter: Payer: Self-pay | Admitting: Oncology

## 2024-04-29 ENCOUNTER — Inpatient Hospital Stay: Attending: Oncology | Admitting: Oncology

## 2024-04-29 VITALS — BP 128/76 | HR 83 | Temp 99.2°F | Resp 16 | Ht 61.0 in | Wt 129.1 lb

## 2024-04-29 DIAGNOSIS — C50411 Malignant neoplasm of upper-outer quadrant of right female breast: Secondary | ICD-10-CM | POA: Diagnosis not present

## 2024-04-29 DIAGNOSIS — Z170 Estrogen receptor positive status [ER+]: Secondary | ICD-10-CM | POA: Diagnosis not present

## 2024-04-29 DIAGNOSIS — Z853 Personal history of malignant neoplasm of breast: Secondary | ICD-10-CM | POA: Insufficient documentation

## 2024-04-29 NOTE — Progress Notes (Signed)
 [facility]  [address] [place],  [place]  [address] [phone]  Clinic Day:  04/29/2024  Referring physician: Holly Lush, NP   HISTORY OF PRESENT ILLNESS:  The patient is a 78 y.o. 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  She comes in today for routine follow-up.  Since her last visit, the patient has been doing well.  She remains off endocrine therapy due to it causing nondescript side effects; she continues to state her disinterest in restarting endocrine therapy.  She denies having any changes in her breast/chest wall region which concern her for early disease recurrence.  Of note, her annual mammogram done in late April 2025 continued to show no evidence of disease recurrence.  This patient also has a history of stage IIA hormone positive breast cancer, for which she underwent a right modified radical mastectomy in 2003, followed by 5 years of endocrine therapy.     PHYSICAL EXAM:  Blood pressure 128/76, pulse 83, temperature 99.2 F (37.3 C), temperature source Oral, resp. rate 16, height 5\' 1"  (1.549 m), weight 129 lb 1.6 oz (58.6 kg), SpO2 97%. Wt Readings from Last 3 Encounters:  04/29/24 129 lb 1.6 oz (58.6 kg)  10/18/23 128 lb 12.8 oz (58.4 kg)  03/20/23 148 lb 4.8 oz (67.3 kg)   Body mass index is 24.39 kg/m. Performance status (ECOG): 0 - Asymptomatic Physical Exam Constitutional:      Appearance: Normal appearance.  HENT:     Mouth/Throat:     Pharynx: Oropharynx is clear. No oropharyngeal exudate.  Cardiovascular:     Rate and Rhythm: Normal rate and regular rhythm.     Heart sounds: No murmur heard.    No friction rub. No gallop.  Pulmonary:     Breath sounds: Normal breath sounds.  Chest:  Breasts:    Right: Absent. No swelling, bleeding, inverted nipple, mass, nipple discharge or skin change.     Left: No swelling, bleeding, inverted nipple,  mass, nipple discharge or skin change.     Comments: Right breast prosthesis present Abdominal:     General: Bowel sounds are normal. There is no distension.     Palpations: Abdomen is soft. There is no mass.     Tenderness: There is no abdominal tenderness.  Musculoskeletal:        General: No tenderness.     Cervical back: Normal range of motion and neck supple.     Right lower leg: No edema.     Left lower leg: No edema.  Lymphadenopathy:     Cervical: No cervical adenopathy.     Right cervical: No superficial, deep or posterior cervical adenopathy.    Left cervical: No superficial, deep or posterior cervical adenopathy.     Upper Body:     Right upper body: No supraclavicular or axillary adenopathy.     Left upper body: No supraclavicular or axillary adenopathy.     Lower Body: No right inguinal adenopathy. No left inguinal adenopathy.  Skin:    Coloration: Skin is not jaundiced.     Findings: No lesion or rash.  Neurological:     General: No focal deficit present.     Mental Status: She is alert and oriented to person, place, and time. Mental status is at baseline.  Psychiatric:        Mood and Affect: Mood normal.  Behavior: Behavior normal.        Thought Content: Thought content normal.        Judgment: Judgment normal.   ASSESSMENT & PLAN:  Assessment/Plan:  A 78 y.o. 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  Based upon her clinical breast exam today and recent annual mammogram, the patient remains disease free.  As mentioned previously, the patient remains uninterested in restarting endocrine therapy for her disease despite knowing it is standard of care for her hormone positive breast cancer.  Clinically she is doing well.  As that is the case, I will see her back in 6 months for her next clinical breast exam. The patient understands all the plans discussed today and is in agreement with  them.    Tasnia Spegal Felicia Horde, MD

## 2024-04-29 NOTE — Progress Notes (Deleted)
 With [facility]  [address] [place],  [place]  [address] [phone]  Clinic Day:  10/18/2023  Referring physician: Holly Lush, NP  HISTORY OF PRESENT ILLNESS:  The patient is a 78 y.o. 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  She comes in today for routine follow-up.  Since her last visit, the patient has been doing well.  She remains off endocrine therapy due to it causing nondescript side effects; she continues to state her disinterest in restarting endocrine therapy.  She denies having any changes in her breast/chest wall region which concern her for early disease recurrence.  Of note, her annual mammogram done in late April 2025 continued to show no evidence of disease recurrence.    This patient also has a history of stage IIA hormone positive breast cancer, for which she underwent a right modified radical mastectomy in 2003, followed by 5 years of endocrine therapy.   PHYSICAL EXAM:  There were no vitals taken for this visit. Wt Readings from Last 3 Encounters:  10/18/23 128 lb 12.8 oz (58.4 kg)  03/20/23 148 lb 4.8 oz (67.3 kg)  09/14/22 150 lb 4.8 oz (68.2 kg)   There is no height or weight on file to calculate BMI. Performance status (ECOG): 0 - Asymptomatic Physical Exam Constitutional:      Appearance: Normal appearance.  HENT:     Mouth/Throat:     Pharynx: Oropharynx is clear. No oropharyngeal exudate.  Cardiovascular:     Rate and Rhythm: Normal rate and regular rhythm.     Heart sounds: No murmur heard.    No friction rub. No gallop.  Pulmonary:     Breath sounds: Normal breath sounds.  Chest:  Breasts:    Right: No swelling, bleeding, inverted nipple, mass, nipple discharge or skin change.     Left: No swelling, bleeding, inverted nipple, mass, nipple discharge or skin change.     Comments: Reconstructed right breast Abdominal:     General:  Bowel sounds are normal. There is no distension.     Palpations: Abdomen is soft. There is no mass.     Tenderness: There is no abdominal tenderness.  Musculoskeletal:        General: No tenderness.     Cervical back: Normal range of motion and neck supple.     Right lower leg: No edema.     Left lower leg: No edema.  Lymphadenopathy:     Cervical: No cervical adenopathy.     Right cervical: No superficial, deep or posterior cervical adenopathy.    Left cervical: No superficial, deep or posterior cervical adenopathy.     Upper Body:     Right upper body: No supraclavicular or axillary adenopathy.     Left upper body: No supraclavicular or axillary adenopathy.     Lower Body: No right inguinal adenopathy. No left inguinal adenopathy.  Skin:    Coloration: Skin is not jaundiced.     Findings: No lesion or rash.  Neurological:     General: No focal deficit present.     Mental Status: She is alert and oriented to person, place, and time. Mental status is at baseline.  Psychiatric:        Mood and Affect: Mood normal.        Behavior: Behavior normal.        Thought Content: Thought content normal.  Judgment: Judgment normal.    ASSESSMENT & PLAN:  Assessment/Plan:  A 78 y.o. 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  Based upon her clinical breast exam today, the patient remains disease free.  As mentioned previously, the patient remains uninterested in restarting endocrine therapy for her disease despite knowing it is standard of care for her hormone positive breast cancer.  Clinically she is doing well.  As that is the case, I will see her back in 6 months for her next clinical breast exam.  Her annual mammogram will be scheduled before her next visit for her continued breast cancer surveillance.  The patient understands all the plans discussed today and is in agreement with them.    Devean Skoczylas Felicia Horde,  MD

## 2024-10-30 ENCOUNTER — Ambulatory Visit: Admitting: Oncology

## 2024-11-05 NOTE — Progress Notes (Deleted)
 [facility]  [address] [place],  [place]  [address] [phone]  Clinic Day:  11/05/2024  Referring physician: Rumalda Eleanor RAMAN, NP   HISTORY OF PRESENT ILLNESS:  The patient is a 78 y.o. 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  She comes in today for routine follow-up.  Since her last visit, the patient has been doing well.  She remains off endocrine therapy due to it causing nondescript side effects; she continues to state her disinterest in restarting endocrine therapy.  She denies having any changes in her breast/chest wall region which concern her for early disease recurrence.  Of note, her annual mammogram done in late April 2025 continued to show no evidence of disease recurrence.  This patient also has a history of stage IIA hormone positive breast cancer, for which she underwent a right modified radical mastectomy in 2003, followed by 5 years of endocrine therapy.     PHYSICAL EXAM:  There were no vitals taken for this visit. Wt Readings from Last 3 Encounters:  04/29/24 129 lb 1.6 oz (58.6 kg)  10/18/23 128 lb 12.8 oz (58.4 kg)  03/20/23 148 lb 4.8 oz (67.3 kg)   Body mass index is 24.39 kg/m. Performance status (ECOG): 0 - Asymptomatic Physical Exam Constitutional:      Appearance: Normal appearance.  HENT:     Mouth/Throat:     Pharynx: Oropharynx is clear. No oropharyngeal exudate.  Cardiovascular:     Rate and Rhythm: Normal rate and regular rhythm.     Heart sounds: No murmur heard.    No friction rub. No gallop.  Pulmonary:     Breath sounds: Normal breath sounds.  Chest:  Breasts:    Right: Absent. No swelling, bleeding, inverted nipple, mass, nipple discharge or skin change.     Left: No swelling, bleeding, inverted nipple, mass, nipple discharge or skin change.     Comments: Right breast prosthesis present Abdominal:     General: Bowel sounds  are normal. There is no distension.     Palpations: Abdomen is soft. There is no mass.     Tenderness: There is no abdominal tenderness.  Musculoskeletal:        General: No tenderness.     Cervical back: Normal range of motion and neck supple.     Right lower leg: No edema.     Left lower leg: No edema.  Lymphadenopathy:     Cervical: No cervical adenopathy.     Right cervical: No superficial, deep or posterior cervical adenopathy.    Left cervical: No superficial, deep or posterior cervical adenopathy.     Upper Body:     Right upper body: No supraclavicular or axillary adenopathy.     Left upper body: No supraclavicular or axillary adenopathy.     Lower Body: No right inguinal adenopathy. No left inguinal adenopathy.  Skin:    Coloration: Skin is not jaundiced.     Findings: No lesion or rash.  Neurological:     General: No focal deficit present.     Mental Status: She is alert and oriented to person, place, and time. Mental status is at baseline.  Psychiatric:        Mood and Affect: Mood normal.        Behavior: Behavior normal.        Thought Content: Thought content normal.  Judgment: Judgment normal.   ASSESSMENT & PLAN:  Assessment/Plan:  A 78 y.o. 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  Based upon her clinical breast exam today and recent annual mammogram, the patient remains disease free.  As mentioned previously, the patient remains uninterested in restarting endocrine therapy for her disease despite knowing it is standard of care for her hormone positive breast cancer.  Clinically she is doing well.  As that is the case, I will see her back in 6 months for her next clinical breast exam. The patient understands all the plans discussed today and is in agreement with them.    Maalle Starrett DELENA Kerns, MD

## 2024-11-06 ENCOUNTER — Inpatient Hospital Stay: Admitting: Oncology
